# Patient Record
Sex: Female | Born: 1990 | Race: White | Hispanic: No | Marital: Single | Smoking: Former smoker
Health system: Southern US, Community
[De-identification: ages and names within clinical notes are randomized; demographics above are authoritative.]

## PROBLEM LIST (undated history)

## (undated) ENCOUNTER — Emergency Department: Admission: EM | Discharge status: Absent without leave | Payer: Self-pay

---

## 2009-01-13 ENCOUNTER — Emergency Department (HOSPITAL_BASED_OUTPATIENT_CLINIC_OR_DEPARTMENT_OTHER): Admission: EM | Admit: 2009-01-13 | Discharge: 2009-01-13 | Payer: Self-pay | Admitting: Emergency Medicine

## 2010-03-15 ENCOUNTER — Emergency Department (HOSPITAL_COMMUNITY): Admission: EM | Admit: 2010-03-15 | Discharge: 2010-03-15 | Payer: Self-pay | Admitting: Emergency Medicine
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
## 2011-03-17 LAB — TYPE AND SCREEN
ABO/RH(D): A POS
Antibody Screen: NEGATIVE

## 2011-03-17 LAB — CBC
HCT: 39 % (ref 36.0–46.0)
RBC: 4.08 MIL/uL (ref 3.87–5.11)
RDW: 14 % (ref 11.5–15.5)
WBC: 10.2 10*3/uL (ref 4.0–10.5)

## 2011-03-17 LAB — URINALYSIS, ROUTINE W REFLEX MICROSCOPIC
Bilirubin Urine: NEGATIVE
Ketones, ur: NEGATIVE mg/dL
Protein, ur: NEGATIVE mg/dL
Specific Gravity, Urine: 1.005 (ref 1.005–1.030)
Urobilinogen, UA: 0.2 mg/dL (ref 0.0–1.0)

## 2011-03-17 LAB — POCT I-STAT, CHEM 8
BUN: 7 mg/dL (ref 6–23)
Chloride: 109 mEq/L (ref 96–112)
Glucose, Bld: 98 mg/dL (ref 70–99)

## 2011-03-17 LAB — COMPREHENSIVE METABOLIC PANEL
Albumin: 4.3 g/dL (ref 3.5–5.2)
Alkaline Phosphatase: 34 U/L — ABNORMAL LOW (ref 39–117)
BUN: 8 mg/dL (ref 6–23)
Calcium: 8.9 mg/dL (ref 8.4–10.5)
Chloride: 109 mEq/L (ref 96–112)
Glucose, Bld: 103 mg/dL — ABNORMAL HIGH (ref 70–99)
Sodium: 141 mEq/L (ref 135–145)
Total Protein: 7.1 g/dL (ref 6.0–8.3)

## 2011-03-17 LAB — URINE MICROSCOPIC-ADD ON

## 2011-03-17 LAB — APTT: aPTT: 29 seconds (ref 24–37)

## 2011-03-17 LAB — LACTIC ACID, PLASMA: Lactic Acid, Venous: 2.9 mmol/L — ABNORMAL HIGH (ref 0.5–2.2)

## 2014-10-16 ENCOUNTER — Emergency Department (HOSPITAL_COMMUNITY): Admission: EM | Admit: 2014-10-16 | Discharge: 2014-10-16 | Discharge status: Against Medical Advice | Payer: Self-pay

## 2015-02-07 ENCOUNTER — Encounter: Payer: Self-pay | Admitting: *Deleted

## 2015-02-07 ENCOUNTER — Emergency Department (INDEPENDENT_AMBULATORY_CARE_PROVIDER_SITE_OTHER)
Admission: EM | Admit: 2015-02-07 | Discharge: 2015-02-07 | Disposition: A | Discharge status: Home / Self Care | Payer: Managed Care, Other (non HMO) | Source: Home / Self Care | Attending: Family Medicine | Admitting: Family Medicine

## 2015-02-07 DIAGNOSIS — R197 Diarrhea, unspecified: Secondary | ICD-10-CM

## 2015-02-07 DIAGNOSIS — R112 Nausea with vomiting, unspecified: Secondary | ICD-10-CM

## 2015-02-07 MED ORDER — ONDANSETRON 4 MG PO TBDP: 4.0000 mg | ORAL_TABLET | Freq: Three times a day (TID) | ORAL | Status: DC | PRN

## 2015-02-07 NOTE — ED Notes (Signed)
Pt c/o vomiting, diarrhea, and body aches x 3 days. Denies fever.

## 2015-02-07 NOTE — Discharge Instructions (Signed)
Begin clear liquids (Pedialyte while having diarrhea) until improved, then advance to a BRAT diet (Bananas, Rice, Applesauce, Toast).  Then gradually resume a regular diet when tolerated.  Avoid milk products until well.  To decrease diarrhea, mix one teaspoon Citrucel (methylcellulose) in 2 oz water and drink one to three times daily.  Do not drink extra fluids with this dose and do not drink fluids for one hour afterwards.  When stools become more formed, may take Imodium (loperamide) once or twice daily to decrease stool frequency.  °If symptoms become significantly worse during the night or over the weekend, proceed to the local emergency room. ° ° °Diarrhea °Diarrhea is frequent loose and watery bowel movements. It can cause you to feel weak and dehydrated. Dehydration can cause you to become tired and thirsty, have a dry mouth, and have decreased urination that often is dark yellow. Diarrhea is a sign of another problem, most often an infection that will not last long. In most cases, diarrhea typically lasts 2-3 days. However, it can last longer if it is a sign of something more serious. It is important to treat your diarrhea as directed by your caregiver to lessen or prevent future episodes of diarrhea. °CAUSES  °Some common causes include: °· Gastrointestinal infections caused by viruses, bacteria, or parasites. °· Food poisoning or food allergies. °· Certain medicines, such as antibiotics, chemotherapy, and laxatives. °· Artificial sweeteners and fructose. °· Digestive disorders. °HOME CARE INSTRUCTIONS °· Ensure adequate fluid intake (hydration): Have 1 cup (8 oz) of fluid for each diarrhea episode. Avoid fluids that contain simple sugars or sports drinks, fruit juices, whole milk products, and sodas. Your urine should be clear or pale yellow if you are drinking enough fluids. Hydrate with an oral rehydration solution that you can purchase at pharmacies, retail stores, and online. You can prepare an oral  rehydration solution at home by mixing the following ingredients together: °¨  - tsp table salt. °¨ ¾ tsp baking soda. °¨  tsp salt substitute containing potassium chloride. °¨ 1  tablespoons sugar. °¨ 1 L (34 oz) of water. °· Certain foods and beverages may increase the speed at which food moves through the gastrointestinal (GI) tract. These foods and beverages should be avoided and include: °¨ Caffeinated and alcoholic beverages. °¨ High-fiber foods, such as raw fruits and vegetables, nuts, seeds, and whole grain breads and cereals. °¨ Foods and beverages sweetened with sugar alcohols, such as xylitol, sorbitol, and mannitol. °· Some foods may be well tolerated and may help thicken stool including: °¨ Starchy foods, such as rice, toast, pasta, low-sugar cereal, oatmeal, grits, baked potatoes, crackers, and bagels. °¨ Bananas. °¨ Applesauce. °· Add probiotic-rich foods to help increase healthy bacteria in the GI tract, such as yogurt and fermented milk products. °· Wash your hands well after each diarrhea episode. °· Only take over-the-counter or prescription medicines as directed by your caregiver. °· Take a warm bath to relieve any burning or pain from frequent diarrhea episodes. °SEEK IMMEDIATE MEDICAL CARE IF:  °· You are unable to keep fluids down. °· You have persistent vomiting. °· You have blood in your stool, or your stools are black and tarry. °· You do not urinate in 6-8 hours, or there is only a small amount of very dark urine. °· You have abdominal pain that increases or localizes. °· You have weakness, dizziness, confusion, or light-headedness. °· You have a severe headache. °· Your diarrhea gets worse or does not get better. °· You   You have a fever or persistent symptoms for more than 2-3 days.  You have a fever and your symptoms suddenly get worse. MAKE SURE YOU:   Understand these instructions.  Will watch your condition.  Will get help right away if you are not doing well or get  worse. Document Released: 11/28/2002 Document Revised: 04/24/2014 Document Reviewed: 08/15/2012 [facility] Patient Information 2015 [employer] [place]. This information is not intended to replace advice given to you by your health care provider. Make sure you discuss any questions you have with your health care provider.

## 2015-02-07 NOTE — ED Provider Notes (Signed)
CSN: [id number]     Arrival date & time 02/07/15  1417 History   First MD Initiated Contact with Patient 02/07/15 1438     Chief Complaint  Patient presents with  . Diarrhea  . Emesis  . Generalized Body Aches      HPI Comments: Two days ago patient developed nausea, vomiting, and diarrhea with myalgias, headache, sweats, and fatigue.  She has had watery stools and about seven episodes of vomiting.  Her nausea persists.  Denies recent foreign travel, or drinking untreated water in a wilderness environment.  She denies recent antibiotic use.  She states that her boss and two children have similar symptoms.   Patient is a 24 y.o. female presenting with vomiting. The history is provided by the patient.  Emesis Severity:  Moderate Timing:  Intermittent Number of daily episodes:  7 Quality:  Stomach contents Able to tolerate:  Liquids Progression:  Improving Chronicity:  New Recent urination:  Decreased Relieved by:  Nothing Worsened by:  Food smell Ineffective treatments:  None tried Associated symptoms: chills, diarrhea, headaches and myalgias   Associated symptoms: no abdominal pain, no arthralgias, no cough, no fever, no sore throat and no URI   Diarrhea:    Quality:  Watery   Number of occurrences:  4   Severity:  Mild   Duration:  1 day   Timing:  Rare   Progression:  Resolved Risk factors: sick contacts   Risk factors: no suspect food intake and no travel to endemic areas     History reviewed. No pertinent past medical history. History reviewed. No pertinent past surgical history. History reviewed. No pertinent family history. History  Substance Use Topics  . Smoking status: Former Games developermoker  . Smokeless tobacco: Not on file  . Alcohol Use: No   OB History    Gravida Para Term Preterm AB TAB SAB Ectopic Multiple Living   1              Review of Systems  Constitutional: Positive for chills.  HENT: Negative for sore throat.   Gastrointestinal: Positive for vomiting  and diarrhea. Negative for abdominal pain.  Musculoskeletal: Positive for myalgias. Negative for arthralgias.  Neurological: Positive for headaches.  All other systems reviewed and are negative.   Allergies  Review of patient's allergies indicates no known allergies.  Home Medications   Prior to Admission medications   Medication Sig Start Date End Date Taking? Authorizing Provider  Prenatal Vit-Fe Fumarate-FA (PRENATAL MULTIVITAMIN) TABS tablet Take 1 tablet by mouth daily at 12 noon.   Yes Historical Provider, MD  ondansetron (ZOFRAN ODT) 4 MG disintegrating tablet Take 1 tablet (4 mg total) by mouth every 8 (eight) hours as needed for nausea or vomiting. Dissolve under tongue 02/07/15   Lattie HawStephen A Eleno Weimar, MD   BP 128/92 mmHg  Pulse 104  Temp(Src) 98.4 F (36.9 C) (Oral)  Resp 18  Ht 5\' 1"  (1.549 m)  Wt 228 lb (103.42 kg)  BMI 43.10 kg/m2  SpO2 99% Physical Exam Nursing notes and Vital Signs reviewed. Appearance:  Patient appears stated age, and in no acute distress.  Patient is obese (BMI 43.1) Eyes:  Pupils are equal, round, and reactive to light and accomodation.  Extraocular movement is intact.  Conjunctivae are not inflamed  Ears:  Canals normal.  Tympanic membranes normal.  Nose:  Normal turbinates.  No sinus tenderness.   Pharynx:  Normal; moist mucous membranes  Neck:  Supple.  No adenopathy.  Lungs:  Clear  to auscultation.  Breath sounds are equal.  Heart:  Regular rate and rhythm without murmurs, rubs, or gallops.  Abdomen:  Nontender without masses or hepatosplenomegaly.  Bowel sounds are present.  No CVA or flank tenderness.  Extremities:  No edema.  No calf tenderness Skin:  No rash present.   ED Course  Procedures  none     MDM   1. Nausea and vomiting, vomiting of unspecified type; suspect viral gastroenteritis   2. Diarrhea    Administered Zofran ODT  PO Rx for Zofran ODT . Begin clear liquids (Pedialyte while having diarrhea) until improved,  then advance to a [employer] (Bananas, Rice, Applesauce, Toast).  Then gradually resume a regular diet when tolerated.  Avoid milk products until well.  To decrease diarrhea, mix one teaspoon Citrucel (methylcellulose) in 2 oz water and drink one to three times daily.  Do not drink extra fluids with this dose and do not drink fluids for one hour afterwards.  When stools become more formed, may take Imodium (loperamide) once or twice daily to decrease stool frequency.  If symptoms become significantly worse during the night or over the weekend, proceed to the local emergency room.  Followup with Family Doctor if not improved in about 4 to 5 days.    Lattie Haw, MD 02/13/15 [phone]

## 2017-12-23 ENCOUNTER — Other Ambulatory Visit: Payer: Self-pay

## 2017-12-23 ENCOUNTER — Encounter: Payer: Self-pay | Admitting: *Deleted

## 2017-12-23 ENCOUNTER — Emergency Department (INDEPENDENT_AMBULATORY_CARE_PROVIDER_SITE_OTHER)
Admission: EM | Admit: 2017-12-23 | Discharge: 2017-12-23 | Disposition: A | Discharge status: Home / Self Care | Payer: BLUE CROSS/BLUE SHIELD | Source: Home / Self Care | Attending: Family Medicine | Admitting: Family Medicine

## 2017-12-23 DIAGNOSIS — R030 Elevated blood-pressure reading, without diagnosis of hypertension: Secondary | ICD-10-CM

## 2017-12-23 DIAGNOSIS — H6692 Otitis media, unspecified, left ear: Secondary | ICD-10-CM

## 2017-12-23 MED ORDER — CEFDINIR 300 MG PO CAPS: 300.0000 mg | ORAL_CAPSULE | Freq: Two times a day (BID) | ORAL | 0 refills | Status: AC

## 2017-12-23 NOTE — ED Triage Notes (Signed)
Pt reports that [facility] and [facility] given Am[facility]. She reports that she didn't take the last 3 tablets because she felt better. She now c/o LT ear pain again x 2 days. She took the old rx for Amoxicillin today at 0830 and 1230.

## 2017-12-23 NOTE — ED Provider Notes (Signed)
[facility]    CSN: [id number] Arrival date & time: 12/23/17  1744     History   Chief Complaint Chief Complaint  Patient presents with  . Otalgia    HPI Dorothy Galvan is a 27 y.o. female.   HPI Dorothy FossaBrittany K Gent is a 27 y.o. female presenting to UC with c/o worsening Left ear pain over the last 2 days. Pain has been waxing and waning but sharp and severe at times. Currently, 3/10.  [facility], dx with AOM and started on amoxicillin.  She kept forgetting to take her medication so she did miss a few days and had 3 pills left.  She took a dose of her amoxicillin at 8:30 and 12:30 today. Denies fever, chills, cough, congestion or sore throat. She has not taken anything for pain today.   BP elevated in triage. Hx of same. She has been monitoring and notes it has been elevated since giving birth to her second child 7 months ago. She does not have a PCP.  Denies HA, dizziness or chest pain.    History reviewed. No pertinent past medical history.  There are no active problems to display for this patient.   History reviewed. No pertinent surgical history.  OB History    Gravida Para Term Preterm AB Living   1             SAB TAB Ectopic Multiple Live Births                   Home Medications    Prior to Admission medications   Medication Sig Start Date End Date Taking? Authorizing Provider  cefdinir (OMNICEF) 300 MG capsule Take 1 capsule (300 mg total) by mouth 2 (two) times daily for 10 days. 12/23/17 01/02/18  Lurene ShadowPhelps, Lauranne Beyersdorf O, PA-C    Family History History reviewed. No pertinent family history.  Social History Social History   Tobacco Use  . Smoking status: Current Every Day Smoker    Packs/day: 0.50    Types: Cigarettes  . Smokeless tobacco: Never Used  Substance Use Topics  . Alcohol use: No  . Drug use: No     Allergies   Patient has no known allergies.   Review of Systems Review of Systems  Constitutional:  Negative for chills and fever.  HENT: Positive for ear pain (Left). Negative for congestion, sore throat, trouble swallowing and voice change.   Respiratory: Negative for cough and shortness of breath.   Cardiovascular: Negative for chest pain and palpitations.  Gastrointestinal: Negative for abdominal pain, diarrhea, nausea and vomiting.  Musculoskeletal: Negative for arthralgias, back pain and myalgias.  Skin: Negative for rash.     Physical Exam Triage Vital Signs ED Triage Vitals  Enc Vitals Group     BP 12/23/17 1813 (!) 141/102     Pulse Rate 12/23/17 1813 (!) 101     Resp 12/23/17 1813 16     Temp 12/23/17 1813 98.7 F (37.1 C)     Temp Source 12/23/17 1813 Oral     SpO2 12/23/17 1813 99 %     Weight 12/23/17 1814 253 lb (114.8 kg)     Height 12/23/17 1814 5\' 2"  (1.575 m)     Head Circumference --      Peak Flow --      Pain Score 12/23/17 1814 3     Pain Loc --      Pain Edu? --  Excl. in GC? --    No data found.  Updated Vital Signs BP 136/83 (BP Location: Right Arm)   Pulse (!) 101   Temp 98.7 F (37.1 C) (Oral)   Resp 16   Ht 5\' 2"  (1.575 m)   Wt 253 lb (114.8 kg)   LMP 12/10/2017   SpO2 99%   Breastfeeding? Unknown   BMI 46.27 kg/m   Visual Acuity Right Eye Distance:   Left Eye Distance:   Bilateral Distance:    Right Eye Near:   Left Eye Near:    Bilateral Near:     Physical Exam  Constitutional: She is oriented to person, place, and time. She appears well-developed and well-nourished. No distress.  HENT:  Head: Normocephalic and atraumatic.  Right Ear: Tympanic membrane normal.  Left Ear: Tympanic membrane is erythematous and bulging.  Nose: Nose normal. Right sinus exhibits no maxillary sinus tenderness and no frontal sinus tenderness. Left sinus exhibits no maxillary sinus tenderness and no frontal sinus tenderness.  Mouth/Throat: Uvula is midline, oropharynx is clear and moist and mucous membranes are normal.  Eyes: EOM are normal.    Neck: Normal range of motion. Neck supple.  Cardiovascular: Normal rate and regular rhythm.  Pulmonary/Chest: Effort normal and breath sounds normal. No stridor. No respiratory distress. She has no wheezes. She has no rales.  Musculoskeletal: Normal range of motion.  Lymphadenopathy:    She has no cervical adenopathy.  Neurological: She is alert and oriented to person, place, and time.  Skin: Skin is warm and dry. She is not diaphoretic.  Psychiatric: She has a normal mood and affect. Her behavior is normal.  Nursing note and vitals reviewed.    UC Treatments / Results  Labs (all labs ordered are listed, but only abnormal results are displayed) Labs Reviewed - No data to display  EKG  EKG Interpretation None       Radiology No results found.  Procedures Procedures (including critical care time)  Medications Ordered in UC Medications - No data to display   Initial Impression / Assessment and Plan / UC Course  I have reviewed the triage vital signs and the nursing notes.  Pertinent labs & imaging results that were available during my care of the patient were reviewed by me and considered in my medical decision making (see chart for details).     Exam c/w persistent Left AOM Will start pt on Cefdinir and strongly encouraged to take entire dose as prescribed to help treat entire infection.   Encouraged to monitor her BP and f/u with a PCP for recheck. She may need prescription medication if life style modification does not help.  Pt info packet provided.   Final Clinical Impressions(s) / UC Diagnoses   Final diagnoses:  Left acute otitis media  Elevated blood pressure reading    ED Discharge Orders        Ordered    cefdinir (OMNICEF) 300 MG capsule  2 times daily     12/23/17 1823       Controlled Substance Prescriptions [place] Controlled Substance Registry consulted? Not Applicable   Rolla Plate 12/23/17 1610

## 2018-03-17 ENCOUNTER — Other Ambulatory Visit: Payer: Self-pay

## 2018-03-17 ENCOUNTER — Emergency Department (INDEPENDENT_AMBULATORY_CARE_PROVIDER_SITE_OTHER)
Admission: EM | Admit: 2018-03-17 | Discharge: 2018-03-17 | Disposition: A | Discharge status: Home / Self Care | Payer: BLUE CROSS/BLUE SHIELD | Source: Home / Self Care | Attending: Family Medicine | Admitting: Family Medicine

## 2018-03-17 DIAGNOSIS — H6983 Other specified disorders of Eustachian tube, bilateral: Secondary | ICD-10-CM

## 2018-03-17 MED ORDER — AMOXICILLIN 875 MG PO TABS: 875.0000 mg | ORAL_TABLET | Freq: Two times a day (BID) | ORAL | 0 refills | Status: DC

## 2018-03-17 MED ORDER — PREDNISONE 20 MG PO TABS: ORAL_TABLET | ORAL | 0 refills | Status: DC

## 2018-03-17 NOTE — Discharge Instructions (Addendum)
Try taking a non-sedating antihistamine such as Zyrtec daily.

## 2018-03-17 NOTE — ED Triage Notes (Signed)
Started this am with nasal congestion.  Sharp pain in left side of jaw that shoots into left ear and temple.

## 2018-03-17 NOTE — ED Provider Notes (Signed)
[facility]    CSN: [id number] Arrival date & time: 03/17/18  1806     History   Chief Complaint Chief Complaint  Patient presents with  . Jaw Pain  . Otalgia    left    HPI Dorothy Galvan is a 27 y.o. female.   Patient reports that she had a mild sore throat two days ago that improved.  This morning she developed nasal congestion and sharp pain in her left jaw that radiated to her left ear and temple.  No cough.  No fevers, chills, and sweats.  She had left otitis media in January 2019 and her present left ear pain is similar.  The history is provided by the patient.    History reviewed. No pertinent past medical history.  There are no active problems to display for this patient.   Past Surgical History:  Procedure Laterality Date  . CESAREAN SECTION      OB History    Gravida  1   Para      Term      Preterm      AB      Living        SAB      TAB      Ectopic      Multiple      Live Births               Home Medications    Prior to Admission medications   Medication Sig Start Date End Date Taking? Authorizing Provider  amoxicillin (AMOXIL) 875 MG tablet Take 1 tablet (875 mg total) by mouth 2 (two) times daily. 03/17/18   Lattie HawBeese, Tesha Archambeau A, MD  predniSONE (DELTASONE) 20 MG tablet Take one tab by mouth twice daily for 4 days, then one daily. Take with food. 03/17/18   Lattie HawBeese, Dianelys Scinto A, MD    Family History History reviewed. No pertinent family history.  Social History Social History   Tobacco Use  . Smoking status: Current Every Day Smoker    Packs/day: 0.50    Types: Cigarettes  . Smokeless tobacco: Never Used  Substance Use Topics  . Alcohol use: No  . Drug use: No     Allergies   Patient has no known allergies.   Review of Systems Review of Systems + sore throat No cough No pleuritic pain No wheezing + nasal congestion + post-nasal drainage No sinus pain/pressure No itchy/red eyes + left  earache No hemoptysis No SOB No fever/chills No nausea No vomiting No abdominal pain No diarrhea No urinary symptoms No skin rash No fatigue No myalgias No headache    Physical Exam Triage Vital Signs ED Triage Vitals  Enc Vitals Group     BP 03/17/18 1851 136/88     Pulse Rate 03/17/18 1851 (!) 105     Resp --      Temp 03/17/18 1851 98.7 F (37.1 C)     Temp Source 03/17/18 1851 Oral     SpO2 03/17/18 1851 98 %     Weight 03/17/18 1852 252 lb (114.3 kg)     Height 03/17/18 1852 5\' 2"  (1.575 m)     Head Circumference --      Peak Flow --      Pain Score 03/17/18 1851 4     Pain Loc --      Pain Edu? --      Excl. in GC? --    No data found.  Updated Vital  Signs BP 136/88 (BP Location: Right Arm)   Pulse (!) 105   Temp 98.7 F (37.1 C) (Oral)   Ht 5\' 2"  (1.575 m)   Wt 252 lb (114.3 kg)   LMP 03/08/2018   SpO2 98%   BMI 46.09 kg/m   Visual Acuity Right Eye Distance:   Left Eye Distance:   Bilateral Distance:    Right Eye Near:   Left Eye Near:    Bilateral Near:     Physical Exam Nursing notes and Vital Signs reviewed. Appearance:  Patient appears stated age, and in no acute distress Eyes:  Pupils are equal, round, and reactive to light and accomodation.  Extraocular movement is intact.  Conjunctivae are not inflamed  Ears:  Canals normal.  Tympanic membranes normal, although left tympanic membrane appears slightly contracted.  No TMJ tenderness. Nose:  Congested turbinates.  No sinus tenderness.   Mouth:  Normal:  No tooth tenderness. Pharynx:  Normal Neck:  Supple.  No adenopathy.  Lungs:  Clear to auscultation.  Breath sounds are equal.  Moving air well. Heart:  Regular rate and rhythm without murmurs, rubs, or gallops.  Abdomen:  Nontender Extremities:  No edema.  Skin:  No rash present.    UC Treatments / Results  Labs (all labs ordered are listed, but only abnormal results are displayed) Labs Reviewed - Tympanometry:  Right ear  tympanogram negative peak pressure; Left ear tympanogram negative peak pressure.  EKG None Radiology No results found.  Procedures Procedures (including critical care time)  Medications Ordered in UC Medications - No data to display   Initial Impression / Assessment and Plan / UC Course  I have reviewed the triage vital signs and the nursing notes.  Pertinent labs & imaging results that were available during my care of the patient were reviewed by me and considered in my medical decision making (see chart for details).    Begin prednisone burst/taper, and empiric amoxicillin. Try taking non-sedating antihistamine such as Zyrtec daily. Followup with ENT if not improving.    Final Clinical Impressions(s) / UC Diagnoses   Final diagnoses:  Eustachian tube dysfunction, bilateral    ED Discharge Orders        Ordered    predniSONE (DELTASONE) 20 MG tablet     03/17/18 1927    amoxicillin (AMOXIL) 875 MG tablet  2 times daily     03/17/18 1927         Lattie Haw, MD 03/18/18 2138

## 2019-03-02 ENCOUNTER — Emergency Department (INDEPENDENT_AMBULATORY_CARE_PROVIDER_SITE_OTHER)
Admission: EM | Admit: 2019-03-02 | Discharge: 2019-03-02 | Disposition: A | Discharge status: Home / Self Care | Payer: BLUE CROSS/BLUE SHIELD | Source: Home / Self Care

## 2019-03-02 ENCOUNTER — Other Ambulatory Visit: Payer: Self-pay

## 2019-03-02 ENCOUNTER — Encounter: Payer: Self-pay | Admitting: *Deleted

## 2019-03-02 DIAGNOSIS — R6889 Other general symptoms and signs: Secondary | ICD-10-CM | POA: Diagnosis not present

## 2019-03-02 DIAGNOSIS — J101 Influenza due to other identified influenza virus with other respiratory manifestations: Secondary | ICD-10-CM

## 2019-03-02 LAB — POCT INFLUENZA A/B
Influenza A, POC: POSITIVE — AB
Influenza B, POC: NEGATIVE

## 2019-03-02 MED ORDER — ACETAMINOPHEN 325 MG PO TABS
650.0000 mg | ORAL_TABLET | Freq: Once | ORAL | Status: AC
  Administered 2019-03-02: 650 mg via ORAL

## 2019-03-02 MED ORDER — OSELTAMIVIR PHOSPHATE 75 MG PO CAPS: 75.0000 mg | ORAL_CAPSULE | Freq: Two times a day (BID) | ORAL | 0 refills | Status: AC

## 2019-03-02 NOTE — ED Triage Notes (Signed)
Pt c/o body aches, fever, HA, cough and runny nose x 2 days.

## 2019-03-02 NOTE — ED Provider Notes (Addendum)
[facility]    CSN: [id number] Arrival date & time: 03/02/19  0901     History   Chief Complaint Chief Complaint  Patient presents with  . Generalized Body Aches  . Fever    HPI VERONIQUE KEARLEY is a 28 y.o. female.   HPI RABEKA BOUNDY is a 28 y.o. female presenting to UC with c/o sudden onset worsening body aches, fever, HA, dry cough, and rhinorrhea for 2 days. No known sick contacts but she notes hanging out with friends on Friday, they were all well at that time but then over the last 2 days, they have similar symptoms of pt. She has taken advil, last dose about 6 hours ago. Denies n/v/d.    History reviewed. No pertinent past medical history.  There are no active problems to display for this patient.   Past Surgical History:  Procedure Laterality Date  . CESAREAN SECTION      OB History    Gravida  1   Para      Term      Preterm      AB      Living        SAB      TAB      Ectopic      Multiple      Live Births               Home Medications    Prior to Admission medications   Medication Sig Start Date End Date Taking? Authorizing Provider  oseltamivir (TAMIFLU) 75 MG capsule Take 1 capsule (75 mg total) by mouth every 12 (twelve) hours. 03/02/19   Lurene Shadow, PA-C    Family History History reviewed. No pertinent family history.  Social History Social History   Tobacco Use  . Smoking status: Former Smoker    Packs/day: 0.50    Types: Cigarettes  . Smokeless tobacco: Never Used  Substance Use Topics  . Alcohol use: Yes    Comment: rarely  . Drug use: No     Allergies   Patient has no known allergies.   Review of Systems Review of Systems  Constitutional: Positive for fatigue and fever. Negative for chills.  HENT: Positive for congestion. Negative for ear pain, sore throat, trouble swallowing and voice change.   Respiratory: Positive for cough. Negative for shortness of breath.    Cardiovascular: Negative for chest pain and palpitations.  Gastrointestinal: Negative for abdominal pain, diarrhea, nausea and vomiting.  Musculoskeletal: Positive for arthralgias, back pain and myalgias.  Skin: Negative for rash.  Neurological: Positive for headaches. Negative for dizziness and light-headedness.     Physical Exam Triage Vital Signs ED Triage Vitals  Enc Vitals Group     BP 03/02/19 0919 124/82     Pulse Rate 03/02/19 0919 (!) 108     Resp 03/02/19 0919 18     Temp 03/02/19 0919 100.3 F (37.9 C)     Temp Source 03/02/19 0919 Oral     SpO2 03/02/19 0919 98 %     Weight 03/02/19 0920 230 lb (104.3 kg)     Height 03/02/19 0920 5\' 2"  (1.575 m)     Head Circumference --      Peak Flow --      Pain Score 03/02/19 0920 0     Pain Loc --      Pain Edu? --      Excl. in GC? --    No data  found.  Updated Vital Signs BP 124/82 (BP Location: Right Arm)   Pulse (!) 108   Temp 100.3 F (37.9 C) (Oral)   Resp 18   Ht  (1.575 m)   Wt 230 lb (104.3 kg)   LMP 03/01/2019   SpO2 98%   BMI 42.07 kg/m   Visual Acuity Right Eye Distance:   Left Eye Distance:   Bilateral Distance:    Right Eye Near:   Left Eye Near:    Bilateral Near:     Physical Exam Vitals signs and nursing note reviewed.  Constitutional:      Appearance: Normal appearance. She is well-developed.  HENT:     Head: Normocephalic and atraumatic.     Right Ear: Tympanic membrane normal.     Left Ear: Tympanic membrane normal.     Nose: Nose normal.     Mouth/Throat:     Lips: Pink.     Mouth: Mucous membranes are moist.     Pharynx: Oropharynx is clear. Uvula midline.  Neck:     Musculoskeletal: Normal range of motion.  Cardiovascular:     Rate and Rhythm: Regular rhythm. Tachycardia present.     Comments: Mild tachycardia Pulmonary:     Effort: Pulmonary effort is normal. No respiratory distress.     Breath sounds: Normal breath sounds. No stridor. No wheezing or rhonchi.   Musculoskeletal: Normal range of motion.  Skin:    General: Skin is warm and dry.  Neurological:     Mental Status: She is alert and oriented to person, place, and time.  Psychiatric:        Behavior: Behavior normal.      UC Treatments / Results  Labs (all labs ordered are listed, but only abnormal results are displayed) Labs Reviewed  POCT INFLUENZA A/B - Abnormal; Notable for the following components:      Result Value   Influenza A, POC Positive (*)    All other components within normal limits    EKG None  Radiology No results found.  Procedures Procedures (including critical care time)  Medications Ordered in UC Medications  acetaminophen (TYLENOL) tablet 650 mg (650 mg Oral Given 03/02/19 0953)    Initial Impression / Assessment and Plan / UC Course  I have reviewed the triage vital signs and the nursing notes.  Pertinent labs & imaging results that were available during my care of the patient were reviewed by me and considered in my medical decision making (see chart for details).     Rapid flu: POSITIVE A Rx: tamiflu No evidence of underlying bacterial infection at this time Home care info provided  Final Clinical Impressions(s) / UC Diagnoses   Final diagnoses:  Flu-like symptoms  Influenza A     Discharge Instructions      You may take  acetaminophen every 4-6 hours or in combination with ibuprofen 400-600mg  every 6-8 hours as needed for pain, inflammation, and fever.  Be sure to well hydrated with clear liquids and get at least 8 hours of sleep at night, preferably more while sick.   Please follow up with family medicine in 1 week if needed.     ED Prescriptions    Medication Sig Dispense Auth. Provider   oseltamivir (TAMIFLU) 75 MG capsule Take 1 capsule (75 mg total) by mouth every 12 (twelve) hours. 10 capsule Lurene Shadow, PA-C     Controlled Substance Prescriptions [place] Controlled Substance Registry consulted? Not  Applicable   Waylan Rocher  O, PA-C 03/02/19 0959    Lurene Shadow, PA-C 03/02/19 1000

## 2019-03-02 NOTE — Discharge Instructions (Signed)
  You may take 500mg acetaminophen every 4-6 hours or in combination with ibuprofen 400-600mg every 6-8 hours as needed for pain, inflammation, and fever.  Be sure to well hydrated with clear liquids and get at least 8 hours of sleep at night, preferably more while sick.   Please follow up with family medicine in 1 week if needed.   

## 2021-04-15 ENCOUNTER — Encounter: Payer: Self-pay | Admitting: Emergency Medicine

## 2021-04-15 ENCOUNTER — Emergency Department (INDEPENDENT_AMBULATORY_CARE_PROVIDER_SITE_OTHER)
Admission: EM | Admit: 2021-04-15 | Discharge: 2021-04-15 | Disposition: A | Discharge status: Home / Self Care | Payer: 59 | Source: Home / Self Care

## 2021-04-15 DIAGNOSIS — R1032 Left lower quadrant pain: Secondary | ICD-10-CM

## 2021-04-15 DIAGNOSIS — R112 Nausea with vomiting, unspecified: Secondary | ICD-10-CM

## 2021-04-15 DIAGNOSIS — R6883 Chills (without fever): Secondary | ICD-10-CM

## 2021-04-15 MED ORDER — ONDANSETRON HCL 4 MG PO TABS: 4.0000 mg | ORAL_TABLET | Freq: Four times a day (QID) | ORAL | 0 refills | Status: AC

## 2021-04-15 MED ORDER — DICYCLOMINE HCL 20 MG PO TABS: 20.0000 mg | ORAL_TABLET | Freq: Two times a day (BID) | ORAL | 0 refills | Status: AC

## 2021-04-15 NOTE — ED Provider Notes (Signed)
[facility]    CSN: [id number] Arrival date & time: 04/15/21  1458      History   Chief Complaint Chief Complaint  Patient presents with  . Right Sided Pain    HPI Dorothy Galvan is a 30 y.o. female.   Reports that LLQ pain woke her up in the middle of the night last night. Reports nausea, vomiting, fever with this. Denies previous symptoms. Has tried to take tylenol with no relief. Denies sick contacts. Has positive history of Covid. Has not completed Covid vaccines. Has not completed flu vaccine. Denies headache, diarrhea, melena, hematemesis, rash, other symptoms.   ROS per HPI  The history is provided by the patient.    History reviewed. No pertinent past medical history.  There are no problems to display for this patient.   Past Surgical History:  Procedure Laterality Date  . CESAREAN SECTION      OB History    Gravida  1   Para      Term      Preterm      AB      Living        SAB      IAB      Ectopic      Multiple      Live Births               Home Medications    Prior to Admission medications   Medication Sig Start Date End Date Taking? Authorizing Provider  dicyclomine (BENTYL) 20 MG tablet Take 1 tablet (20 mg total) by mouth 2 (two) times daily. 04/15/21  Yes Moshe Cipro, NP  ondansetron (ZOFRAN) 4 MG tablet Take 1 tablet (4 mg total) by mouth every 6 (six) hours. 04/15/21  Yes Moshe Cipro, NP  oseltamivir (TAMIFLU) 75 MG capsule Take 1 capsule (75 mg total) by mouth every 12 (twelve) hours. 03/02/19   Lurene Shadow, PA-C    Family History No family history on file.  Social History Social History   Tobacco Use  . Smoking status: Former Smoker    Packs/day: 0.50    Types: Cigarettes  . Smokeless tobacco: Never Used  Vaping Use  . Vaping Use: Never used  Substance Use Topics  . Alcohol use: Yes    Comment: rarely  . Drug use: No     Allergies   Patient has no known  allergies.   Review of Systems Review of Systems   Physical Exam Triage Vital Signs ED Triage Vitals  Enc Vitals Group     BP 04/15/21 1524 (!) 131/96     Pulse Rate 04/15/21 1524 90     Resp --      Temp 04/15/21 1524 99.2 F (37.3 C)     Temp Source 04/15/21 1524 Oral     SpO2 04/15/21 1524 97 %     Weight --      Height --      Head Circumference --      Peak Flow --      Pain Score 04/15/21 1525 3     Pain Loc --      Pain Edu? --      Excl. in GC? --    No data found.  Updated Vital Signs BP (!) 131/96 (BP Location: Left Arm)   Pulse 90   Temp 99.2 F (37.3 C) (Oral)   LMP 04/08/2021   SpO2 97%   Visual Acuity Right Eye Distance:  Left Eye Distance:   Bilateral Distance:    Right Eye Near:   Left Eye Near:    Bilateral Near:     Physical Exam Vitals and nursing note reviewed.  Constitutional:      General: She is not in acute distress.    Appearance: She is well-developed. She is obese. She is not ill-appearing.  HENT:     Head: Normocephalic and atraumatic.     Right Ear: Tympanic membrane, ear canal and external ear normal.     Left Ear: Tympanic membrane, ear canal and external ear normal.     Nose: Nose normal.     Mouth/Throat:     Mouth: Mucous membranes are moist.     Pharynx: Posterior oropharyngeal erythema present.  Eyes:     Extraocular Movements: Extraocular movements intact.     Conjunctiva/sclera: Conjunctivae normal.     Pupils: Pupils are equal, round, and reactive to light.  Cardiovascular:     Rate and Rhythm: Normal rate and regular rhythm.     Heart sounds: Normal heart sounds. No murmur heard.   Pulmonary:     Effort: Pulmonary effort is normal. No respiratory distress.     Breath sounds: Normal breath sounds. No stridor. No wheezing, rhonchi or rales.  Chest:     Chest wall: No tenderness.  Abdominal:     General: There is no distension.     Palpations: Abdomen is soft. There is no mass.     Tenderness: There is  abdominal tenderness (generalized). There is no right CVA tenderness, left CVA tenderness, guarding or rebound.     Hernia: No hernia is present.     Comments: Hyperactive bowel sounds   Musculoskeletal:        General: Normal range of motion.     Cervical back: Normal range of motion and neck supple.  Lymphadenopathy:     Cervical: Cervical adenopathy present.  Skin:    General: Skin is warm and dry.     Capillary Refill: Capillary refill takes less than 2 seconds.  Neurological:     General: No focal deficit present.     Mental Status: She is alert and oriented to person, place, and time.  Psychiatric:        Mood and Affect: Mood normal.        Behavior: Behavior normal.        Thought Content: Thought content normal.      UC Treatments / Results  Labs (all labs ordered are listed, but only abnormal results are displayed) Labs Reviewed  COVID-19, FLU A+B NAA   Narrative:    Test(s) 140142-Influenza A, NAA; 140143-Influenza B, NAA was developed and its performance characteristics determined by [facility]. It has not been cleared or approved by the Food and Drug Administration. Performed at:  [address] [place], [place]  [id number] Lab Director: Jolene Schimke MD, Phone:  [phone]    EKG   Radiology No results found.  Procedures Procedures (including critical care time)  Medications Ordered in UC Medications - No data to display  Initial Impression / Assessment and Plan / UC Course  I have reviewed the triage vital signs and the nursing notes.  Pertinent labs & imaging results that were available during my care of the patient were reviewed by me and considered in my medical decision making (see chart for details).    LLQ Pain Nausea/Vomiting Chills  Zofran prescribed for nausea and vomiting prn Dicyclomine prescribed for  abdominal cramping Discussed that symptoms likely have viral etiology Covid and flu swab obtained in office  today.   Patient instructed to quarantine until results are back and negative.   If results are negative, patient may resume daily schedule as tolerated once they are fever free for 24 hours without the use of antipyretic medications.   If results are positive, patient instructed to quarantine for at least 5 days from symptom onset.  If after 5 days symptoms have resolved, may return to work with a well fitting mask for the next 5 days. If symptomatic after day 5, isolation should be extended to 10 days. Patient instructed to follow-up with primary care or with this office as needed.   Patient instructed to follow-up in the ER for trouble swallowing, trouble breathing, other concerning symptoms.   Final Clinical Impressions(s) / UC Diagnoses   Final diagnoses:  LLQ pain  Nausea and vomiting, intractability of vomiting not specified, unspecified vomiting type  Chills     Discharge Instructions     I have sent in Zofran for you to take one tablet every 8 hours as needed for nausea.  Also sent in dicyclomine for you to take twice a day as needed for abdominal cramping.  Your COVID and Influenza tests are pending.  You should self quarantine until the test results are back.    Take Tylenol or ibuprofen as needed for fever or discomfort.  Rest and keep yourself hydrated.    Follow-up with your primary care provider if your symptoms are not improving.        ED Prescriptions    Medication Sig Dispense Auth. Provider   ondansetron (ZOFRAN) 4 MG tablet Take 1 tablet (4 mg total) by mouth every 6 (six) hours. 12 tablet Moshe Cipro, NP   dicyclomine (BENTYL) 20 MG tablet Take 1 tablet (20 mg total) by mouth 2 (two) times daily. 20 tablet Moshe Cipro, NP     PDMP not reviewed this encounter.   Moshe Cipro, NP 04/19/21 1719

## 2021-04-15 NOTE — ED Triage Notes (Signed)
Patient states that she awoke in the middle of night right lower side/abdominal pain.  Patient has had vomiting episodes and loose stools.  Patient took Tylenol today for pain.

## 2021-04-15 NOTE — Discharge Instructions (Signed)
I have sent in Zofran for you to take one tablet every 8 hours as needed for nausea.  Also sent in dicyclomine for you to take twice a day as needed for abdominal cramping.  Your COVID and Influenza tests are pending.  You should self quarantine until the test results are back.    Take Tylenol or ibuprofen as needed for fever or discomfort.  Rest and keep yourself hydrated.    Follow-up with your primary care provider if your symptoms are not improving.

## 2021-04-17 LAB — COVID-19, FLU A+B NAA
Influenza A, NAA: NOT DETECTED
Influenza B, NAA: NOT DETECTED
SARS-CoV-2, NAA: NOT DETECTED

## 2021-04-19 ENCOUNTER — Telehealth: Payer: Self-pay | Admitting: Emergency Medicine

## 2021-04-19 NOTE — Telephone Encounter (Signed)
Call from pt to see what her COVID /Flu results are - they are negative - pt had been checking her [facility] My Chart

## 2024-09-14 ENCOUNTER — Emergency Department (HOSPITAL_BASED_OUTPATIENT_CLINIC_OR_DEPARTMENT_OTHER)
Admission: EM | Admit: 2024-09-14 | Discharge: 2024-09-14 | Disposition: A | Discharge status: Home / Self Care | Attending: Emergency Medicine | Admitting: Emergency Medicine

## 2024-09-14 ENCOUNTER — Encounter (HOSPITAL_BASED_OUTPATIENT_CLINIC_OR_DEPARTMENT_OTHER): Payer: Self-pay | Admitting: Emergency Medicine

## 2024-09-14 ENCOUNTER — Ambulatory Visit
Admission: EM | Admit: 2024-09-14 | Discharge: 2024-09-14 | Disposition: A | Discharge status: Other Acute Inpatient Hospital

## 2024-09-14 ENCOUNTER — Other Ambulatory Visit: Payer: Self-pay

## 2024-09-14 DIAGNOSIS — N939 Abnormal uterine and vaginal bleeding, unspecified: Secondary | ICD-10-CM | POA: Diagnosis present

## 2024-09-14 DIAGNOSIS — R42 Dizziness and giddiness: Secondary | ICD-10-CM | POA: Diagnosis not present

## 2024-09-14 LAB — PREGNANCY, URINE: Preg Test, Ur: NEGATIVE

## 2024-09-14 LAB — CBC
HCT: 36.9 % (ref 36.0–46.0)
Hemoglobin: 12.3 g/dL (ref 12.0–15.0)
MCH: 30.1 pg (ref 26.0–34.0)
MCHC: 33.3 g/dL (ref 30.0–36.0)
MCV: 90.4 fL (ref 80.0–100.0)
Platelets: 383 K/uL (ref 150–400)
RBC: 4.08 MIL/uL (ref 3.87–5.11)
RDW: 13.2 % (ref 11.5–15.5)
WBC: 9.8 K/uL (ref 4.0–10.5)
nRBC: 0 % (ref 0.0–0.2)

## 2024-09-14 MED ORDER — MEGESTROL ACETATE 400 MG/10ML PO SUSP: ORAL | 0 refills | Status: AC

## 2024-09-14 MED ORDER — MEGESTROL ACETATE 40 MG PO TABS
120.0000 mg | ORAL_TABLET | Freq: Once | ORAL | Status: AC
  Administered 2024-09-14: 120 mg via ORAL
  Filled 2024-09-14: qty 3

## 2024-09-14 NOTE — ED Notes (Signed)
 Patient is being discharged from the Urgent Care and sent to the Emergency Department via pov . Per ragan NP, patient is in need of higher level of care due to vaginal bleeding. Patient is aware and verbalizes understanding of plan of care.  Vitals:   09/14/24 1645  BP: (!) 146/99  Pulse: (!) 101  Resp: 20  Temp: 98.5 F (36.9 C)  SpO2: 98%

## 2024-09-14 NOTE — ED Provider Notes (Addendum)
 [facility]    CSN: [id number] Arrival date & time: 09/14/24  1623      History   Chief Complaint No chief complaint on file.   HPI Dorothy Galvan is a 33 y.o. female.   HPI 33 year old female presents with vaginal bleeding since Monday.  Patient reports is currently 6 months postpartum and started her first cycle since birth on Monday, 09/12/2024.  Patient reports abnormally large volume of blood and reports using roughly 6 pads today since 10 AM.  PMH significant for obesity.  History reviewed. No pertinent past medical history.  There are no active problems to display for this patient.   Past Surgical History:  Procedure Laterality Date   CESAREAN SECTION      OB History     Gravida  1   Para      Term      Preterm      AB      Living         SAB      IAB      Ectopic      Multiple      Live Births               Home Medications    Prior to Admission medications   Medication Sig Start Date End Date Taking? Authorizing Provider  dicyclomine  (BENTYL ) 20 MG tablet Take 1 tablet (20 mg total) by mouth 2 (two) times daily. 04/15/21   Alvia Corean CROME, FNP  ondansetron  (ZOFRAN ) 4 MG tablet Take 1 tablet (4 mg total) by mouth every 6 (six) hours. 04/15/21   Alvia Corean CROME, FNP  oseltamivir  (TAMIFLU ) 75 MG capsule Take 1 capsule (75 mg total) by mouth every 12 (twelve) hours. 03/02/19   Anitra Rocky KIDD, PA-C    Family History History reviewed. No pertinent family history.  Social History Social History   Tobacco Use   Smoking status: Former    Current packs/day: 0.50    Types: Cigarettes   Smokeless tobacco: Never  Vaping Use   Vaping status: Never Used  Substance Use Topics   Alcohol use: Yes    Comment: rarely   Drug use: No     Allergies   Patient has no known allergies.   Review of Systems Review of Systems  Genitourinary:  Positive for vaginal bleeding.  All other systems reviewed and are  negative.    Physical Exam Triage Vital Signs ED Triage Vitals [09/14/24 1635]  Encounter Vitals Group     BP      Girls Systolic BP Percentile      Girls Diastolic BP Percentile      Boys Systolic BP Percentile      Boys Diastolic BP Percentile      Pulse      Resp      Temp      Temp src      SpO2      Weight      Height      Head Circumference      Peak Flow      Pain Score 0     Pain Loc      Pain Education      Exclude from Growth Chart    No data found.  Updated Vital Signs BP (!) 146/99   Pulse (!) 101   Temp 98.5 F (36.9 C)   Resp 20   LMP 09/12/2024   SpO2 98%   Breastfeeding No  Physical Exam Vitals and nursing note reviewed.  Constitutional:      General: She is not in acute distress.    Appearance: Normal appearance. She is obese. She is not ill-appearing.  HENT:     Head: Normocephalic and atraumatic.     Mouth/Throat:     Mouth: Mucous membranes are moist.     Pharynx: Oropharynx is clear.  Eyes:     Extraocular Movements: Extraocular movements intact.     Conjunctiva/sclera: Conjunctivae normal.     Pupils: Pupils are equal, round, and reactive to light.  Cardiovascular:     Rate and Rhythm: Normal rate and regular rhythm.     Pulses: Normal pulses.     Heart sounds: Normal heart sounds. No murmur heard.    No friction rub. No gallop.  Pulmonary:     Effort: Pulmonary effort is normal.     Breath sounds: Normal breath sounds. No wheezing, rhonchi or rales.  Musculoskeletal:        General: Normal range of motion.  Skin:    General: Skin is warm and dry.  Neurological:     General: No focal deficit present.     Mental Status: She is alert and oriented to person, place, and time. Mental status is at baseline.  Psychiatric:        Mood and Affect: Mood normal.        Behavior: Behavior normal.      UC Treatments / Results  Labs (all labs ordered are listed, but only abnormal results are displayed) Labs Reviewed - No data to  display  EKG   Radiology No results found.  Procedures Procedures (including critical care time)  Medications Ordered in UC Medications - No data to display  Initial Impression / Assessment and Plan / UC Course  I have reviewed the triage vital signs and the nursing notes.  Pertinent labs & imaging results that were available during my care of the patient were reviewed by me and considered in my medical decision making (see chart for details).     MDM: 1.  Vaginal bleeding-advised patient to go to [facility] ED now for further evaluation.  Patient is unable to go to emergency room at this moment due to childcare and not wanting to take her young 48-month-old infant daughter with congenital heart disorder and 60-year-old daughter into emergency room setting due to possible sickness.  Advised patient's once childcare has been established please go to [facility] ED now for further evaluation of vaginal bleeding.  Patient agreed and verbalized understanding of these instructions and this plan of care this evening.  Patient discharged to ED hemodynamically stable. Final Clinical Impressions(s) / UC Diagnoses   Final diagnoses:  Vaginal bleeding  Dizziness     Discharge Instructions      Advised patient to go to [facility] ED now for further evaluation of vaginal bleeding.     ED Prescriptions   None    PDMP not reviewed this encounter.   Teddy Sharper, FNP 09/14/24 1655    Teddy Sharper, FNP 09/14/24 [phone]

## 2024-09-14 NOTE — ED Triage Notes (Addendum)
 Pt presents to uc with co vaginal bleeding since Monday. Pt is currently 6 months postpartum and started her first cycle since birth  on Monday . Pt reports it has been abnormally large in volume. Pt reports she has been using roughly 5-6 pads today since 10 am. Has been taking motrin. Pt reports she has called her OBGYN and was told they would get back to her. Pt endorses some mild dizziness when standing earlier which made her concerned.   Pt husband has had a vasectomy shortly after her birth and she is not concerned for pregnancy.   Pt reports she is unable to find child care at this time and does not feel safe bringing her baby in the emergency room.

## 2024-09-14 NOTE — ED Triage Notes (Signed)
 Pt reports heavy vaginal bleeding since Monday night; reports she is going through super tampons q 20 mins; this is her first menstrual cycle since giving birth 6 mo ago

## 2024-09-14 NOTE — ED Provider Notes (Addendum)
 [place] [facility] Provider Note   CSN: [id number] Arrival date & time: 09/14/24  1745     Patient presents with: Vaginal Bleeding   Dorothy Galvan is a 33 y.o. female.   Patient is a 33 year old female who presents with vaginal bleeding.  She had a baby 6 months ago.  She has not had a menstrual cycle since that time.  She said she started having a little bleeding 2 days ago and it got heavier yesterday.  Currently she has been bleeding through super tampons about every 20 minutes.  She is having to use the thick padded disposable underwear in addition to her tampons.  She denies any abdominal pain.  She had a little bit of dizziness earlier today.  No chest pain or ongoing shortness of breath.  No history of similar symptoms in the past.       Prior to Admission medications   Medication Sig Start Date End Date Taking? Authorizing Provider  megestrol  (MEGACE ) 400 MG/10ML suspension Take 3 mLs (120 mg total) by mouth daily for 5 days, THEN 2 mLs (80 mg total) daily for 5 days, THEN 1 mL (40 mg total) daily for 15 days. 09/14/24 10/09/24 Yes Lenor Hollering, MD  dicyclomine  (BENTYL ) 20 MG tablet Take 1 tablet (20 mg total) by mouth 2 (two) times daily. 04/15/21   Alvia Corean CROME, FNP  ondansetron  (ZOFRAN ) 4 MG tablet Take 1 tablet (4 mg total) by mouth every 6 (six) hours. 04/15/21   Alvia Corean CROME, FNP  oseltamivir  (TAMIFLU ) 75 MG capsule Take 1 capsule (75 mg total) by mouth every 12 (twelve) hours. 03/02/19   Anitra Rocky KIDD, PA-C    Allergies: Patient has no known allergies.    Review of Systems  Constitutional:  Positive for fatigue. Negative for fever.  Respiratory:  Negative for shortness of breath.   Cardiovascular:  Negative for chest pain.  Gastrointestinal:  Negative for abdominal pain, nausea and vomiting.  Genitourinary:  Positive for vaginal bleeding. Negative for vaginal discharge and vaginal pain.  Neurological:   Positive for light-headedness. Negative for headaches.    Updated Vital Signs BP (!) 134/96   Pulse 88   Temp 98 F (36.7 C) (Oral)   Resp 18   Ht 5' 2 (1.575 m)   Wt 110.7 kg   LMP 09/12/2024   SpO2 98%   BMI 44.63 kg/m   Physical Exam Constitutional:      Appearance: She is well-developed.  HENT:     Head: Normocephalic and atraumatic.  Eyes:     Pupils: Pupils are equal, round, and reactive to light.  Cardiovascular:     Rate and Rhythm: Normal rate and regular rhythm.     Heart sounds: Normal heart sounds.  Pulmonary:     Effort: Pulmonary effort is normal. No respiratory distress.     Breath sounds: Normal breath sounds. No wheezing or rales.  Chest:     Chest wall: No tenderness.  Abdominal:     General: Bowel sounds are normal.     Palpations: Abdomen is soft.     Tenderness: There is no abdominal tenderness. There is no guarding or rebound.  Genitourinary:    Comments: Moderate amount of dark blood in the vaginal vault, no active bleeding through the os, no clots, no cervical motion tenderness or adnexal tenderness Musculoskeletal:        General: Normal range of motion.     Cervical back: Normal range  of motion and neck supple.  Lymphadenopathy:     Cervical: No cervical adenopathy.  Skin:    General: Skin is warm and dry.     Findings: No rash.  Neurological:     Mental Status: She is alert and oriented to person, place, and time.     (all labs ordered are listed, but only abnormal results are displayed) Labs Reviewed  CBC  PREGNANCY, URINE    EKG: None  Radiology: No results found.   Procedures   Medications Ordered in the ED  megestrol  (MEGACE ) tablet 120 mg (120 mg Oral Given 09/14/24 2020)                                    Medical Decision Making Risk Prescription drug management.   Patient is a 33 year old who presents with vaginal bleeding.  Differential diagnosis includes miscarriage, ectopic pregnancy, dysfunctional  uterine bleeding, uterine fibroids  Pelvic exam was performed which shows no heavy active bleeding.  Her hemoglobin is normal.  Discussed with Dr. Jayne with gynecology.  He recommends putting her on a tapering dose of Megace .  Will prescribe this according to his dosing recommendations.  Encouraged her to have close follow-up with her OB/GYN.  Return precautions were given.     Final diagnoses:  Abnormal vaginal bleeding    ED Discharge Orders          Ordered    megestrol  (MEGACE ) 400 MG/10ML suspension  Daily        09/14/24 2007               Lenor Hollering, MD 09/14/24 [name] Lenor Hollering, MD 09/14/24 2024

## 2024-09-14 NOTE — Discharge Instructions (Signed)
 Start taking the megestrol  as discussed.  Make an appointment to have close follow-up with your OB/GYN.  Return to the emergency room if you have any worsening symptoms.

## 2024-09-14 NOTE — Discharge Instructions (Addendum)
 Advised patient to go to [facility] ED now for further evaluation of vaginal bleeding.
